# Patient Record
Sex: Male | Born: 1965 | Race: White | Hispanic: No | Marital: Married | Smoking: Former smoker
Health system: Southern US, Community
[De-identification: ages and names within clinical notes are randomized; demographics above are authoritative.]

## PROBLEM LIST (undated history)

## (undated) DIAGNOSIS — C140 Malignant neoplasm of pharynx, unspecified: Secondary | ICD-10-CM

## (undated) DIAGNOSIS — I10 Essential (primary) hypertension: Secondary | ICD-10-CM

## (undated) DIAGNOSIS — F32A Depression, unspecified: Secondary | ICD-10-CM

## (undated) HISTORY — PX: KNEE SURGERY: SHX244

## (undated) HISTORY — PX: REPLACEMENT DISC ANTERIOR LUMBAR SPINE: SUR1215

## (undated) HISTORY — PX: FOOT SURGERY: SHX648

---

## 2008-08-22 ENCOUNTER — Emergency Department (HOSPITAL_COMMUNITY): Admission: EM | Admit: 2008-08-22 | Discharge: 2008-08-22 | Payer: Self-pay | Admitting: Emergency Medicine

## 2009-04-26 ENCOUNTER — Ambulatory Visit: Payer: Self-pay | Admitting: Surgery

## 2009-05-13 ENCOUNTER — Ambulatory Visit: Payer: Self-pay | Admitting: Surgery

## 2009-05-13 ENCOUNTER — Inpatient Hospital Stay (HOSPITAL_COMMUNITY): Admission: RE | Admit: 2009-05-13 | Discharge: 2009-05-16 | Payer: Self-pay | Admitting: Orthopedic Surgery

## 2009-05-14 ENCOUNTER — Encounter (INDEPENDENT_AMBULATORY_CARE_PROVIDER_SITE_OTHER): Payer: Self-pay | Admitting: Orthopedic Surgery

## 2009-08-09 ENCOUNTER — Encounter: Admission: RE | Admit: 2009-08-09 | Discharge: 2009-08-09 | Payer: Self-pay | Admitting: Orthopedic Surgery

## 2009-11-15 ENCOUNTER — Encounter: Admission: RE | Admit: 2009-11-15 | Discharge: 2009-11-15 | Payer: Self-pay | Admitting: Orthopedic Surgery

## 2010-02-21 ENCOUNTER — Encounter: Admission: RE | Admit: 2010-02-21 | Discharge: 2010-02-21 | Payer: Self-pay | Admitting: Orthopedic Surgery

## 2010-05-02 ENCOUNTER — Encounter: Payer: Self-pay | Admitting: Orthopedic Surgery

## 2010-06-30 LAB — BASIC METABOLIC PANEL
CO2: 28 mEq/L (ref 19–32)
Calcium: 10.4 mg/dL (ref 8.4–10.5)
Chloride: 103 mEq/L (ref 96–112)
GFR calc Af Amer: >60 mL/min (ref >60)
Glucose, Bld: 129 mg/dL — ABNORMAL HIGH (ref 70–99)
Sodium: 141 mEq/L (ref 135–145)

## 2010-06-30 LAB — CBC
Hemoglobin: 14.6 g/dL (ref 13.0–17.0)
MCHC: 34.6 g/dL (ref 30.0–36.0)
MCV: 90.5 fL (ref 78.0–100.0)
RBC: 4.64 MIL/uL (ref 4.22–5.81)
RDW: 12.7 % (ref 11.5–15.5)

## 2010-06-30 LAB — POCT I-STAT 7, (LYTES, BLD GAS, ICA,H+H)
Acid-base deficit: 2 mmol/L (ref 0.0–2.0)
Calcium, Ion: 1.2 mmol/L (ref 1.12–1.32)
O2 Saturation: 100 %
Potassium: 4.1 mEq/L (ref 3.5–5.1)
Sodium: 139 mEq/L (ref 135–145)
TCO2: 25 mmol/L (ref 0–100)
pCO2 arterial: 41.9 mmHg (ref 35.0–45.0)

## 2010-08-23 NOTE — Assessment & Plan Note (Signed)
OFFICE VISIT   Dennis Castillo, Dennis Castillo  DOB:  08-15-1965                                       04/26/2009  EAVWU#:[id number]   REASON FOR VISIT:  Evaluate for back surgery.   PRIMARY CARE PHYSICIAN:  Dr. Kathrynn Running.   HISTORY:  This is a 45 year old gentleman who I am seeing at the request  of Dr. Shon Baton in anticipation of anterior lumbar surgery.  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  He complains of back pain as well as a sharp pain radiating down his  right side.  He also developed some left buttock pain.  His pain is  worse with bending over and certainly with lifting.  He does get some  relief with resting as well as his TENS unit.   The patient is obese.  He was tried on metformin to help with weight  loss; however, this did not work.  He is also a Jehovah's Witness. He  states that he is okay with Cell Saver and volume expanders but refuses  blood products.  He suffers from hypercholesterolemia which is medically  managed.   REVIEW OF SYSTEMS:  GENERAL:  Positive for weight gain.  VASCULAR:  Positive for pain in the legs with walking.  PSYCH:  Positive for depression and anxiety.   All other review of systems negative as documented in the encounter  form.   PAST MEDICAL HISTORY:  Hypercholesterolemia and obesity.   PAST SURGICAL HISTORY:  Left knee surgery and right thumb surgery.   FAMILY HISTORY:  Negative for cardiovascular disease at an early age.   SOCIAL HISTORY:  He is married with 5 children.  He is a Therapist, nutritional.  He does not smoke.  He has a history of smoking but quit in  1983.  He drinks 2 beers per week.   ALLERGIES:  None.   MEDICATIONS:  Please see chart.   PHYSICAL EXAMINATION:  Heart rate 78, blood pressure 132/86, temperature  is 97.8.  General:  He is well-appearing, in no distress.  HEENT:  Normal.  Lungs:  Clear bilaterally.  Cardiovascular:  Regular rate and  rhythm.  No carotid bruits.  Palpable posterior tibial pulses.  Abdomen:  Obese, soft.  No hepatosplenomegaly.  Musculoskeletal:  Without major  deformities.  Neurologic:  He has no focal weaknesses.  Skin:  Without  rash.  Psych: He has a normal affect.   ASSESSMENT:  Chronic back pain.   PLAN:  I discussed the risks and benefits of proceeding with anterior  lumbar surgery.  We specifically addressed the risk of bleeding and the  risk of retrograde ejaculation as well as wound infection.  With regards  to him being a Jehovah's Witness, he is okay with receiving Cell Saver  and volume expanders.  He does not wish to bank his own blood.  He  refuses blood transfusions.  He understands that should major venous  bleeding occur that he could die from his operation and he understands  that.  He is scheduled to have his operation on February 3rd.  All of  his questions were answered today.     Jorge Ny, MD  Electronically Signed   VWB/MEDQ  D:  04/26/2009  T:  04/27/2009  Job:  [id number]   cc:   Alvy Beal, MD

## 2012-08-26 ENCOUNTER — Ambulatory Visit (INDEPENDENT_AMBULATORY_CARE_PROVIDER_SITE_OTHER): Payer: Managed Care, Other (non HMO)

## 2012-08-26 ENCOUNTER — Other Ambulatory Visit: Payer: Self-pay | Admitting: Orthopedic Surgery

## 2012-08-26 DIAGNOSIS — M549 Dorsalgia, unspecified: Secondary | ICD-10-CM

## 2012-08-26 DIAGNOSIS — IMO0002 Reserved for concepts with insufficient information to code with codable children: Secondary | ICD-10-CM

## 2012-09-05 ENCOUNTER — Other Ambulatory Visit: Payer: Self-pay | Admitting: Orthopedic Surgery

## 2012-09-05 DIAGNOSIS — M545 Low back pain: Secondary | ICD-10-CM

## 2012-09-11 ENCOUNTER — Ambulatory Visit
Admission: RE | Admit: 2012-09-11 | Discharge: 2012-09-11 | Disposition: A | Discharge status: Home / Self Care | Payer: Managed Care, Other (non HMO) | Source: Ambulatory Visit | Attending: Orthopedic Surgery | Admitting: Orthopedic Surgery

## 2012-09-11 VITALS — BP 154/84 | HR 81

## 2012-09-11 DIAGNOSIS — M545 Low back pain: Secondary | ICD-10-CM

## 2012-09-11 MED ORDER — IOHEXOL 180 MG/ML  SOLN
20.0000 mL | Freq: Once | INTRAMUSCULAR | Status: AC | PRN
  Administered 2012-09-11: 20 mL via INTRATHECAL

## 2012-09-11 MED ORDER — MEPERIDINE HCL 100 MG/ML IJ SOLN
100.0000 mg | Freq: Once | INTRAMUSCULAR | Status: AC
  Administered 2012-09-11: 100 mg via INTRAMUSCULAR

## 2012-09-11 MED ORDER — ONDANSETRON HCL 4 MG/2ML IJ SOLN
4.0000 mg | Freq: Once | INTRAMUSCULAR | Status: AC
  Administered 2012-09-11: 4 mg via INTRAMUSCULAR

## 2012-09-11 MED ORDER — DIAZEPAM 5 MG PO TABS
10.0000 mg | ORAL_TABLET | Freq: Once | ORAL | Status: AC
  Administered 2012-09-11: 10 mg via ORAL

## 2012-09-11 NOTE — Progress Notes (Signed)
Pt states he has been off effexor and adderall for the past 2 days. Discharge instructions explained to pt and his wife.

## 2013-06-07 IMAGING — CT CT L SPINE W/ CM
4 of 11 series · 11 of 33 positions shown, 13 images · non-contrast
Comparison: None.

CLINICAL DATA: Back pain

CT MYELOGRAPHY LUMBAR SPINE
TECHNIQUE: CT imaging of the lumbar spine was performed after
intrathecal contrast administration.  Multiplanar CT image
reconstructions were also generated.

[Series 2: l spine bone · axial · 0.27mm/px · z∈[-347,-95]mm · 3 of 102 slices shown, 4 images]
[im 1/102  soft-tissue]
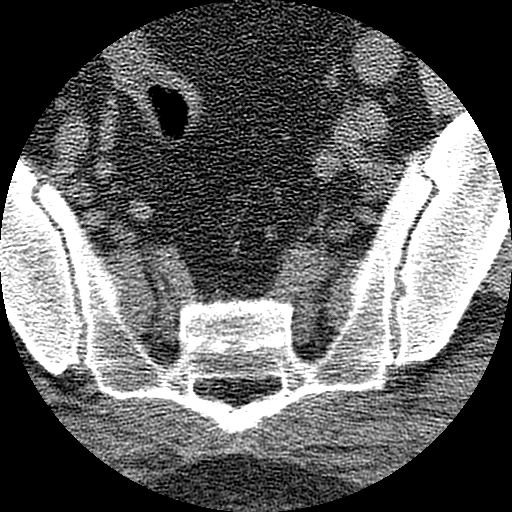
[im 1/102  bone]
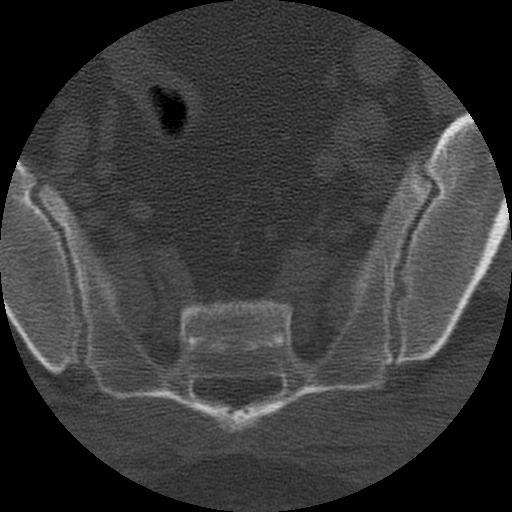
[im 51/102  bone]
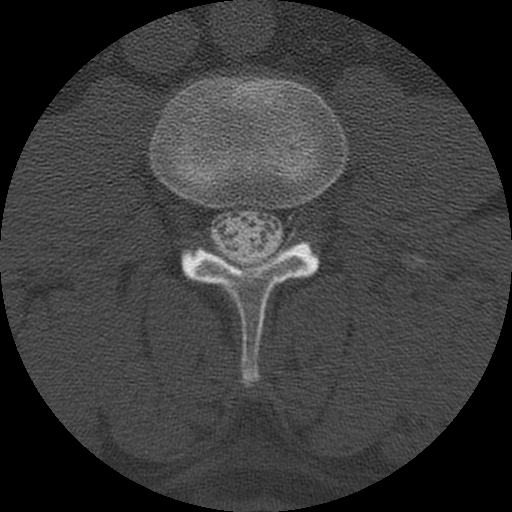
[im 102/102  bone]
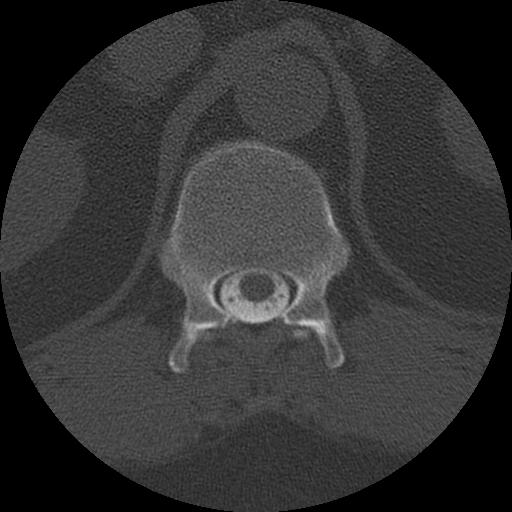

[Series 3: l spine soft · axial · 0.27mm/px · z∈[-262,-180]mm · 2 of 101 slices shown]
[im 34/101  soft-tissue]
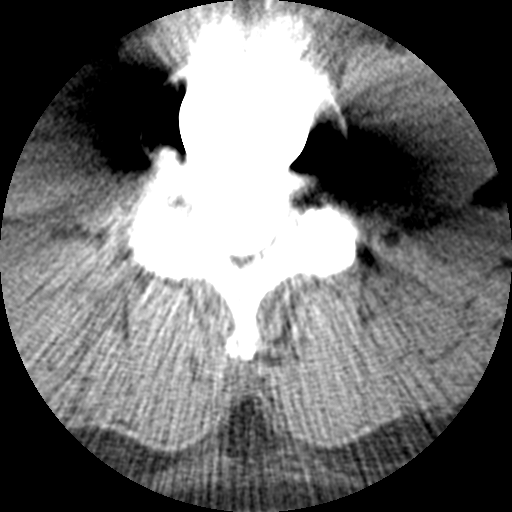
[im 67/101  soft-tissue]
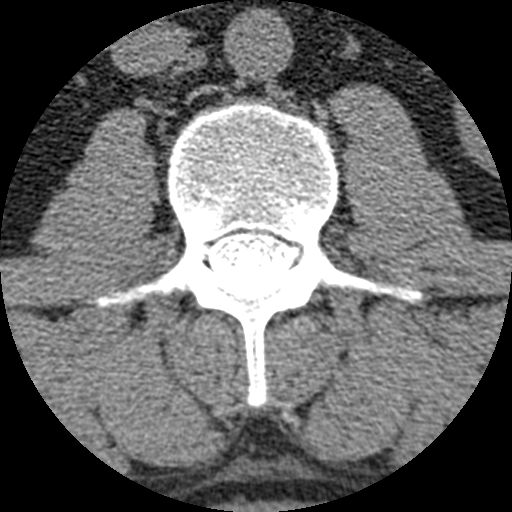

[Series 401: cor lower · coronal · 0.51mm/px · 1 of 59 slices shown]
[im 30/59  bone]
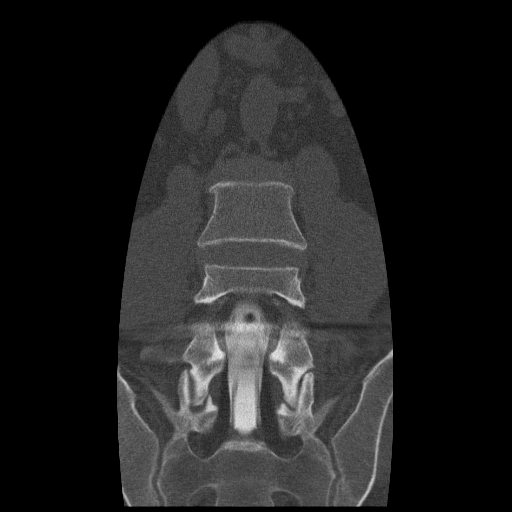

[Series 402: sag · sagittal · 0.51mm/px · 5 of 59 slices shown, 6 images]
[im 20/59  bone]
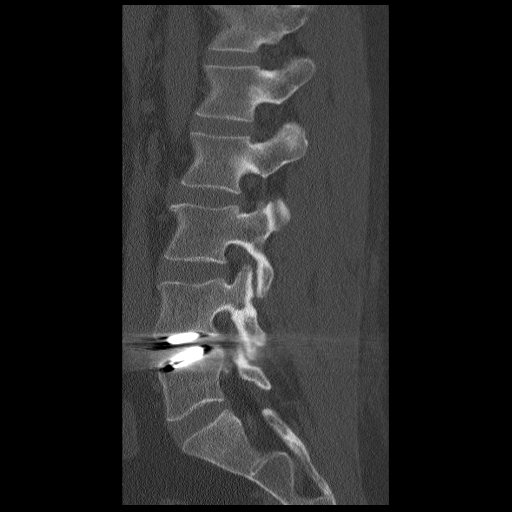
[im 25/59  bone]
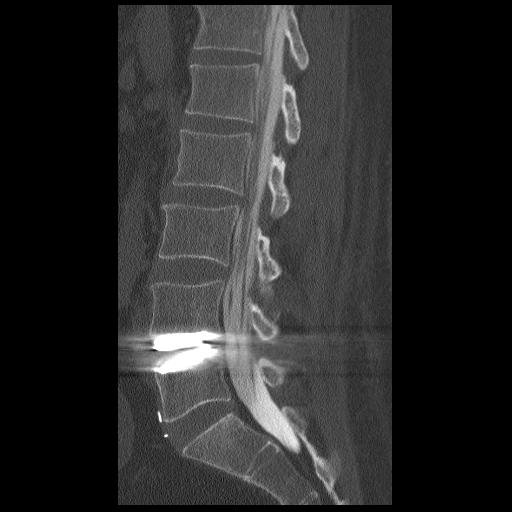
[im 30/59  soft-tissue]
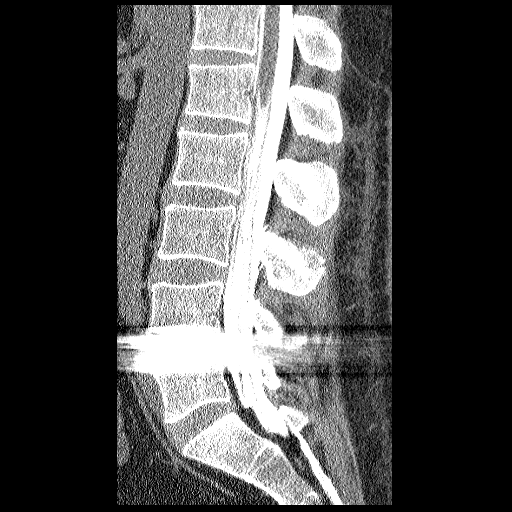
[im 30/59  bone]
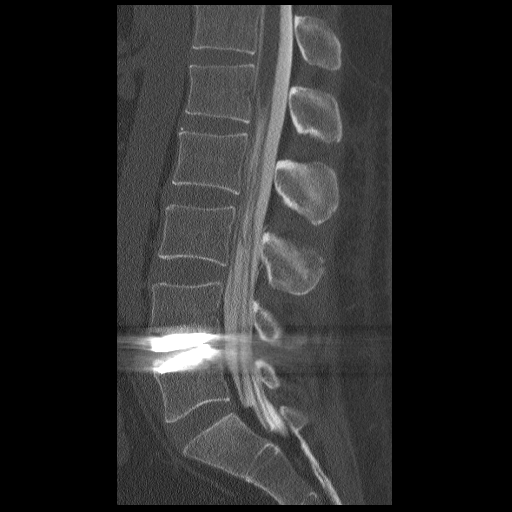
[im 34/59  bone]
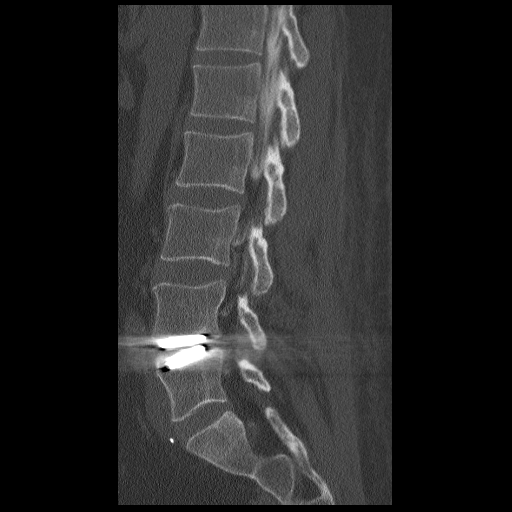
[im 39/59  bone]
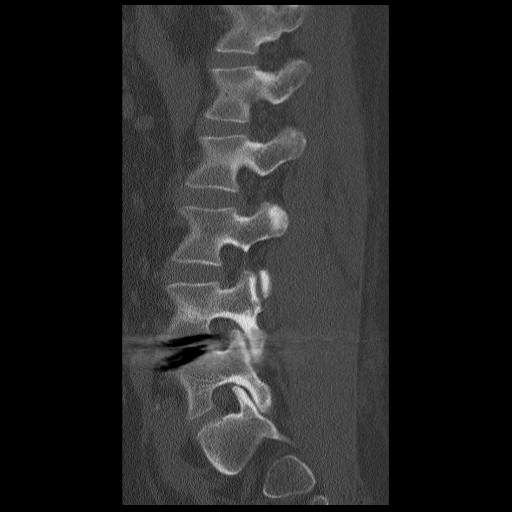

[11 of 33 positions shown; findings below may reference images not displayed]

FINDINGS: Radiologist injection.

Anatomic alignment.  No vertebral compression deformity.  L4-5 disc
arthroplasty is in place.  Conus medullaris terminates at L1-2.

L1-2:  Unremarkable.

L2-3: Shallow left foraminal protrusion without impingement.

L3-4:  Unremarkable.

L4-5:  Disc arthroplasty in place.  No central or foraminal
stenosis.

L5-S1:  Mild left-sided facet arthropathy.  No disc herniation.  No
central or foraminal stenosis.
IMPRESSION: L4-5 disc arthroplasty without complication

Shallow left foraminal L2-3 disc protrusion without impingement.

Mild left facet arthropathy at L5-S1.

## 2013-06-07 IMAGING — RF DG MYELOGRAM LUMBAR
15 of 16 series · 15 of 16 positions shown · IV contrast (omnipaque)
Comparison: none

CLINICAL DATA: Back pain

MYELOGRAM LUMBAR
TECHNIQUE: The procedure, risks, benefits, and alternatives were
explained to the patient. The patient understands and consents.
Under fluoroscopic guidance, a 22 gauge spinal needle was placed in
the CSF space via right L3-4 approach. 20 mL of Omnipaque 180 was
injected.

[Series 1: (hospital) · 1 of 1 slices shown]
[im 1/1]
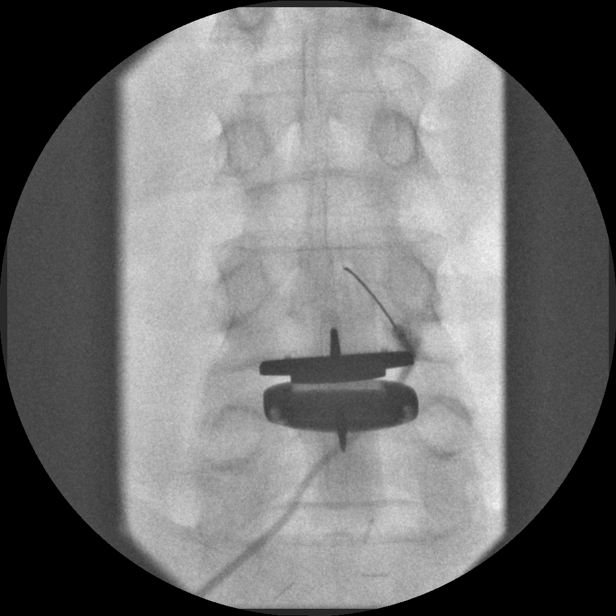

[Series 2: myelogram  white · 1 of 1 slices shown (1 of 11)]
[im 1/1]
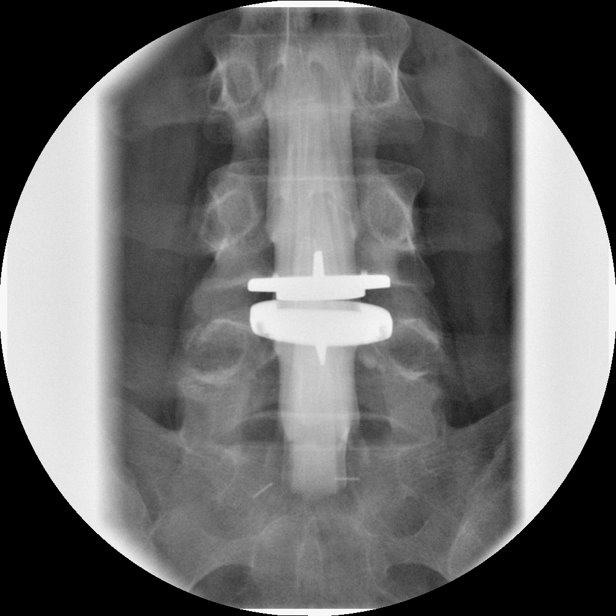

[Series 3: myelogram  white · 1 of 1 slices shown (2 of 11)]
[im 1/1]
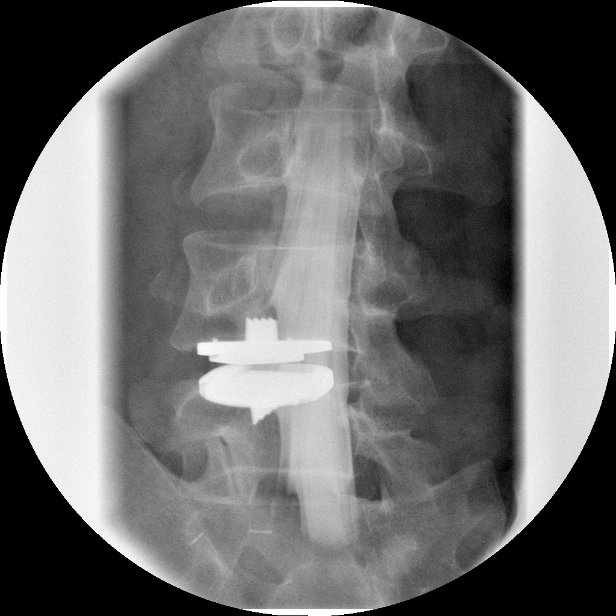

[Series 4: myelogram  white · 1 of 1 slices shown (3 of 11)]
[im 1/1]
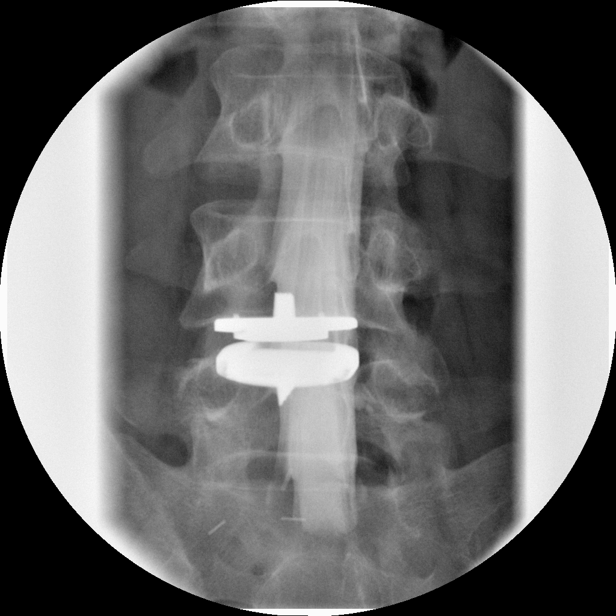

[Series 5: myelogram  white · 1 of 1 slices shown (4 of 11)]
[im 1/1]
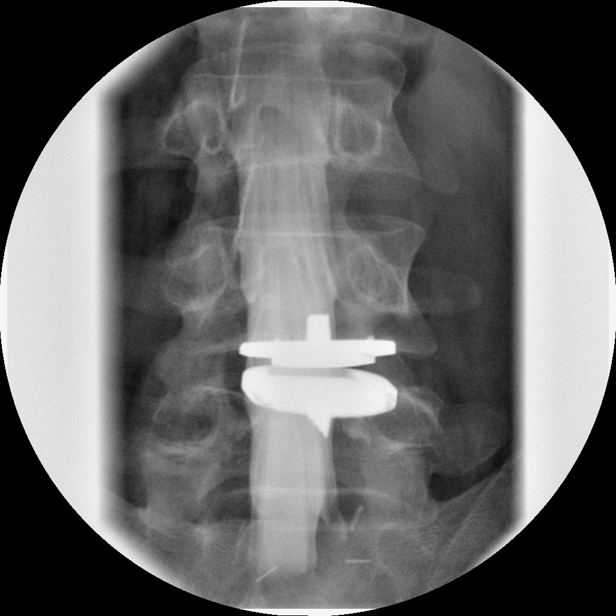

[Series 6: myelogram  white · 1 of 1 slices shown (5 of 11)]
[im 1/1]
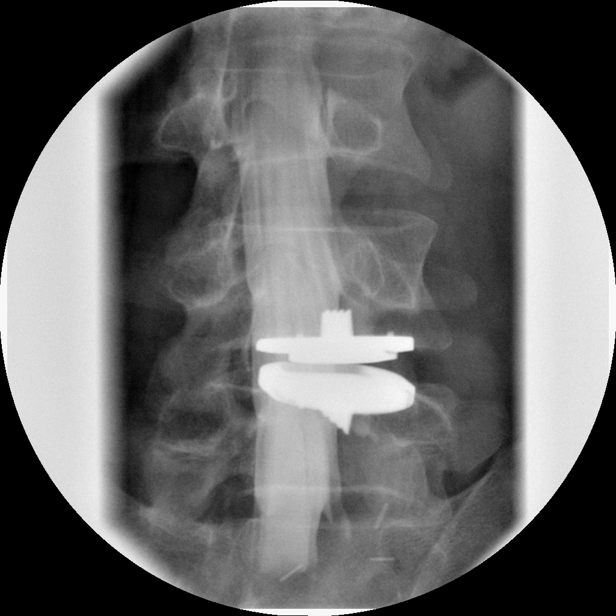

[Series 7: myelogram  white · 1 of 1 slices shown (6 of 11)]
[im 1/1]
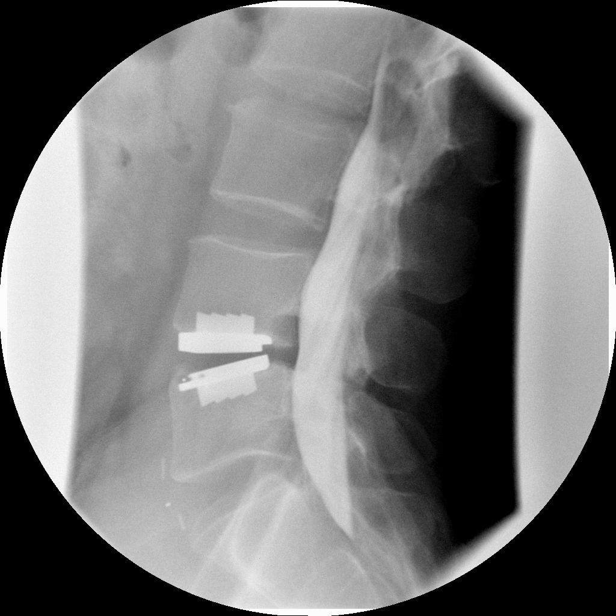

[Series 9: myelogram  white · 1 of 1 slices shown (7 of 11)]
[im 1/1]
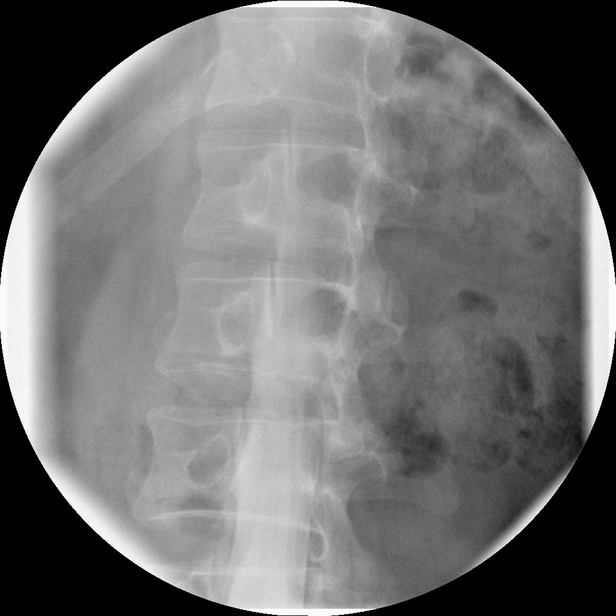

[Series 10: myelogram  white · 1 of 1 slices shown (8 of 11)]
[im 1/1]
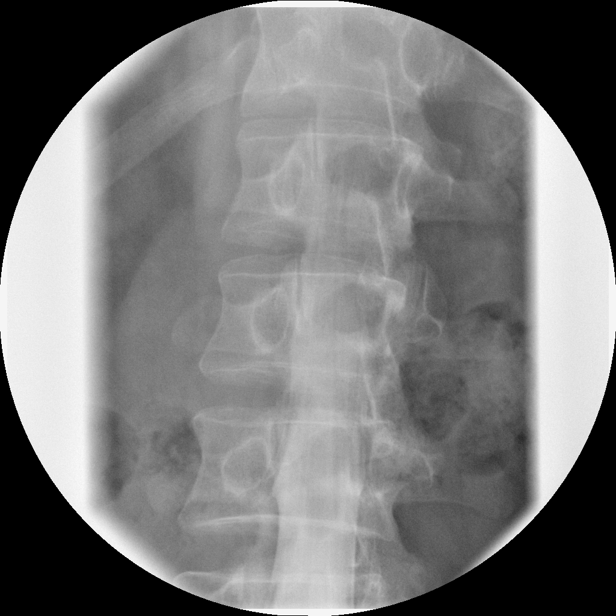

[Series 11: myelogram  white · 1 of 1 slices shown (9 of 11)]
[im 1/1]
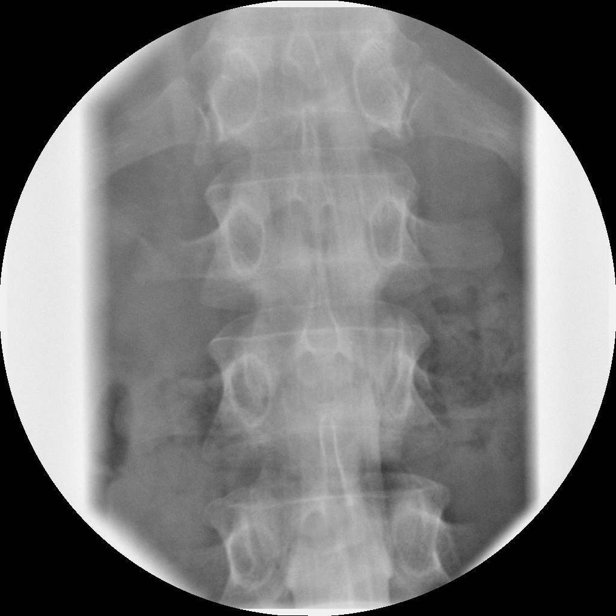

[Series 12: myelogram  white · 1 of 1 slices shown (10 of 11)]
[im 1/1]
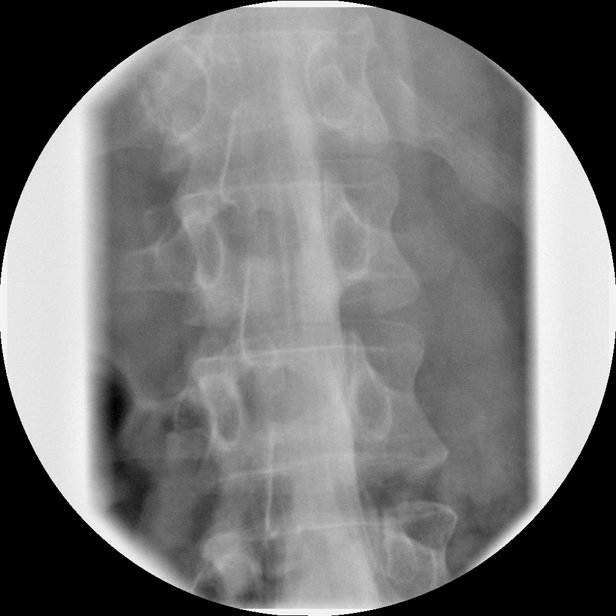

[Series 13: myelogram  white · 1 of 1 slices shown (11 of 11)]
[im 1/1]
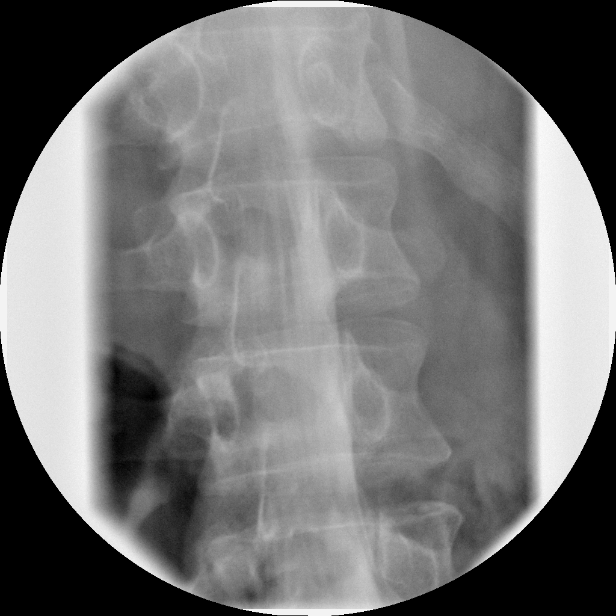

[Series 1001: view not recorded · 0.20mm/px · 1 of 1 slices shown (1 of 3)]
[im 1/1]
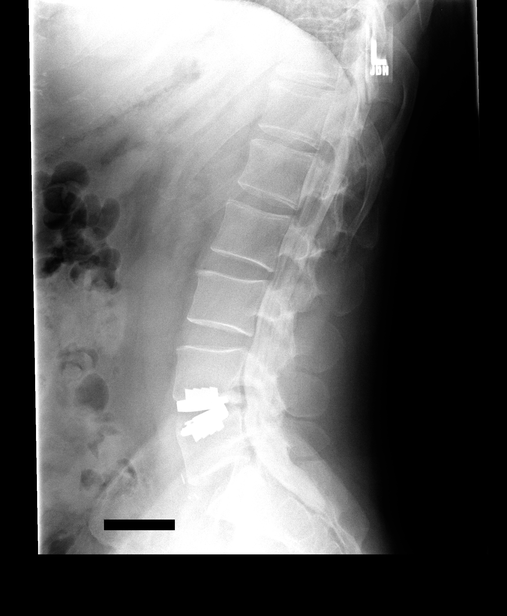

[Series 1002: view not recorded · 0.20mm/px · 1 of 1 slices shown (2 of 3)]
[im 1/1]
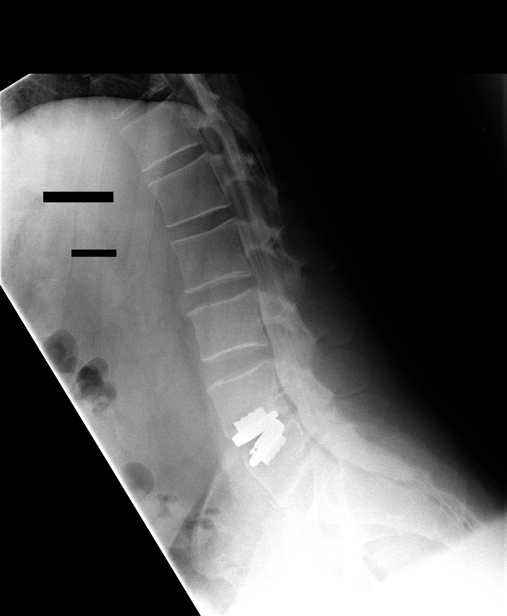

[Series 1003: view not recorded · 0.20mm/px · 1 of 1 slices shown (3 of 3)]
[im 1/1]
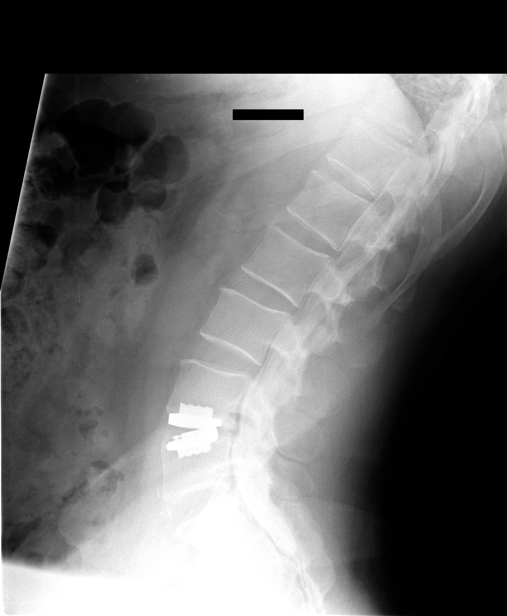

[15 of 16 positions shown; findings below may reference images not displayed]

FINDINGS: Disc arthroplasty and L4-5 is in place.  There is
anatomic alignment of the vertebral bodies.  No obvious spinal
stenosis or neural impingement.  No vertebral compression
deformity. No evidence of instability on the flexion or extension
views.

Complications: None.

Fluoroscopy Time: 2 minutes and 2 seconds.
IMPRESSION: No evidence of impingement.  L4-5 disc arthroplasty without obvious
complication.

## 2022-08-08 ENCOUNTER — Emergency Department (HOSPITAL_BASED_OUTPATIENT_CLINIC_OR_DEPARTMENT_OTHER): Payer: BC Managed Care – PPO

## 2022-08-08 ENCOUNTER — Emergency Department (HOSPITAL_BASED_OUTPATIENT_CLINIC_OR_DEPARTMENT_OTHER)
Admission: EM | Admit: 2022-08-08 | Discharge: 2022-08-08 | Disposition: A | Discharge status: Home / Self Care | Payer: BC Managed Care – PPO | Attending: Emergency Medicine | Admitting: Emergency Medicine

## 2022-08-08 ENCOUNTER — Emergency Department (HOSPITAL_COMMUNITY): Payer: BC Managed Care – PPO

## 2022-08-08 ENCOUNTER — Other Ambulatory Visit: Payer: Self-pay

## 2022-08-08 ENCOUNTER — Encounter (HOSPITAL_BASED_OUTPATIENT_CLINIC_OR_DEPARTMENT_OTHER): Payer: Self-pay | Admitting: Emergency Medicine

## 2022-08-08 DIAGNOSIS — Z85818 Personal history of malignant neoplasm of other sites of lip, oral cavity, and pharynx: Secondary | ICD-10-CM | POA: Insufficient documentation

## 2022-08-08 DIAGNOSIS — I10 Essential (primary) hypertension: Secondary | ICD-10-CM | POA: Insufficient documentation

## 2022-08-08 DIAGNOSIS — R42 Dizziness and giddiness: Secondary | ICD-10-CM | POA: Diagnosis present

## 2022-08-08 DIAGNOSIS — Z87891 Personal history of nicotine dependence: Secondary | ICD-10-CM | POA: Insufficient documentation

## 2022-08-08 HISTORY — DX: Malignant neoplasm of pharynx, unspecified: C14.0

## 2022-08-08 HISTORY — DX: Essential (primary) hypertension: I10

## 2022-08-08 HISTORY — DX: Depression, unspecified: F32.A

## 2022-08-08 LAB — CBC
HCT: 36.3 % — ABNORMAL LOW (ref 39.0–52.0)
Hemoglobin: 10.4 g/dL — ABNORMAL LOW (ref 13.0–17.0)
MCH: 20 pg — ABNORMAL LOW (ref 26.0–34.0)
MCHC: 28.7 g/dL — ABNORMAL LOW (ref 30.0–36.0)
MCV: 69.7 fL — ABNORMAL LOW (ref 80.0–100.0)
Platelets: 269 10*3/uL (ref 150–400)
RBC: 5.21 MIL/uL (ref 4.22–5.81)
RDW: 19.7 % — ABNORMAL HIGH (ref 11.5–15.5)
WBC: 9.4 10*3/uL (ref 4.0–10.5)
nRBC: 0 % (ref 0.0–0.2)

## 2022-08-08 LAB — BASIC METABOLIC PANEL
Anion gap: 9 (ref 5–15)
BUN: 15 mg/dL (ref 6–20)
CO2: 28 mmol/L (ref 22–32)
Calcium: 9.9 mg/dL (ref 8.9–10.3)
Chloride: 102 mmol/L (ref 98–111)
Creatinine, Ser: 0.89 mg/dL (ref 0.61–1.24)
GFR, Estimated: >60 mL/min (ref >60)
Glucose, Bld: 106 mg/dL — ABNORMAL HIGH (ref 70–99)
Potassium: 3.8 mmol/L (ref 3.5–5.1)
Sodium: 139 mmol/L (ref 135–145)

## 2022-08-08 MED ORDER — PROCHLORPERAZINE EDISYLATE 10 MG/2ML IJ SOLN
5.0000 mg | Freq: Once | INTRAMUSCULAR | Status: AC
  Administered 2022-08-08: 5 mg via INTRAVENOUS
  Filled 2022-08-08: qty 2

## 2022-08-08 MED ORDER — LORAZEPAM 2 MG/ML IJ SOLN
1.0000 mg | Freq: Once | INTRAMUSCULAR | Status: AC
  Administered 2022-08-08: 1 mg via INTRAVENOUS
  Filled 2022-08-08: qty 1

## 2022-08-08 MED ORDER — ONDANSETRON 4 MG PO TBDP
8.0000 mg | ORAL_TABLET | Freq: Once | ORAL | Status: DC
  Filled 2022-08-08: qty 2

## 2022-08-08 MED ORDER — LORAZEPAM 2 MG/ML IJ SOLN
INTRAMUSCULAR | Status: AC
  Filled 2022-08-08: qty 1

## 2022-08-08 MED ORDER — SODIUM CHLORIDE 0.9 % IV BOLUS
1000.0000 mL | Freq: Once | INTRAVENOUS | Status: AC
  Administered 2022-08-08: 1000 mL via INTRAVENOUS

## 2022-08-08 MED ORDER — LORAZEPAM 1 MG PO TABS: 1.0000 mg | ORAL_TABLET | Freq: Three times a day (TID) | ORAL | 0 refills | Status: DC | PRN

## 2022-08-08 NOTE — ED Triage Notes (Signed)
Vertigo for the past 3 weeks causing him to be unable to move without vomiting, now causing weakness. His ENT told him there was not they could do for this; states vertigo is secondary to radiation for throat cancer.  He is concerned that this may be something more as it has lasted so long.

## 2022-08-08 NOTE — ED Notes (Signed)
Report called to Tresa Endo, RN at [facility] ED

## 2022-08-08 NOTE — ED Provider Notes (Signed)
  Physical Exam  BP 136/78   Pulse 77   Temp 98 F (36.7 C) (Oral)   Resp 18   Wt 106.6 kg   SpO2 100%   Physical Exam  Procedures  Procedures  ED Course / MDM    Medical Decision Making Amount and/or Complexity of Data Reviewed Labs: ordered. Radiology: ordered.  Risk Prescription drug management.   Transfer for MRI.  Vertigo.  Severe.  Having difficulty walking due to it.  Has previously seen ENT.  Treated with Ativan prior to arrival and Compazine once arrived.  Continued symptoms.  Did not tolerate full MRI due to of reported panic attack.  However patient eager to go home.  Does not want to stay longer.  Will give symptomatic treatment for home.  Instructed patient to watch out for interactions with the medicine as he is already on.  Follow-up with ENT and request to see [facility] ENT.  MRI not completed but did not show acute stroke.      Benjiman Core, MD 08/08/22 [phone]

## 2022-08-08 NOTE — ED Provider Notes (Signed)
[facility]ERGENCY [facility] Emergency Department Provider Note MRN:  [id number]  Arrival date & time: 08/08/22     Chief Complaint   Vertigo History of Present Illness   Dennis Castillo is a 57 y.o. year-old male with a history of hypertension, head neck cancer presenting to the ED with chief complaint of vertigo.  Persistent room spinning dizziness for the past 2 or 3 weeks.  Worse over the past 2 or 3 days.  Worsening headaches over the past 2 or 3 days.  Denies numbness or weakness to the arms or legs.  Tired of feeling this way.  Was told by ENT that there is nothing to do.  Review of Systems  A thorough review of systems was obtained and all systems are negative except as noted in the HPI and PMH.   Patient's Health History    Past Medical History:  Diagnosis Date   Depression    HTN (hypertension)    Throat cancer (HCC)       No family history on file.  Social History   Socioeconomic History   Marital status: Married    Spouse name: Not on file   Number of children: Not on file   Years of education: Not on file   Highest education level: Not on file  Occupational History   Not on file  Tobacco Use   Smoking status: Former   Smokeless tobacco: Never   Tobacco comments:    smoked from 10 to 71 and has since quit  Substance and Sexual Activity   Alcohol use: Not on file   Drug use: Not on file   Sexual activity: Not on file  Other Topics Concern   Not on file  Social History Narrative   Not on file   Social Determinants of Health   Financial Resource Strain: Not on file  Food Insecurity: Not on file  Transportation Needs: Not on file  Physical Activity: Not on file  Stress: Not on file  Social Connections: Not on file  Intimate Partner Violence: Not on file     Physical Exam   Vitals:   08/08/22 0443  BP: (!) 167/99  Pulse: 70  Resp: 18  Temp: 98 F (36.7 C)  SpO2: 100%    CONSTITUTIONAL: Well-appearing, NAD NEURO/PSYCH:  Alert and  oriented x 3, no focal deficits EYES:  eyes equal and reactive ENT/NECK:  no LAD, no JVD CARDIO: Regular rate, well-perfused, normal S1 and S2 PULM:  CTAB no wheezing or rhonchi GI/GU:  non-distended, non-tender MSK/SPINE:  No gross deformities, no edema SKIN:  no rash, atraumatic   *Additional and/or pertinent findings included in MDM below  Diagnostic and Interventional Summary    EKG Interpretation  Date/Time:  Tuesday August 08 2022 04:46:29 EDT Ventricular Rate:  69 PR Interval:  144 QRS Duration: 102 QT Interval:  402 QTC Calculation: 431 R Axis:   76 Text Interpretation: Sinus rhythm Confirmed by Kennis Carina [phone]) on 08/08/2022 5:00:26 AM       Labs Reviewed  CBC - Abnormal; Notable for the following components:      Result Value   Hemoglobin 10.4 (*)    HCT 36.3 (*)    MCV 69.7 (*)    MCH 20.0 (*)    MCHC 28.7 (*)    RDW 19.7 (*)    All other components within normal limits  BASIC METABOLIC PANEL - Abnormal; Notable for the following components:   Glucose, Bld 106 (*)  All other components within normal limits    CT HEAD WO CONTRAST ( )  Final Result    MR BRAIN WO CONTRAST    (Results Pending)    Medications  sodium chloride 0.9 % bolus 1,000 mL (1,000 mLs Intravenous New Bag/Given 08/08/22 0508)  LORazepam (ATIVAN) injection 1 mg ( Intravenous Not Given 08/08/22 0530)     Procedures  /  Critical Care Procedures  ED Course and Medical Decision Making  Initial Impression and Ddx Patient theorizes that his vertigo is due to chemo/radiation that he received in the past for head neck cancer.  This is possible, as is central vertigo, BPPV.  He describes vertigo, room spinning sensation very clearly.  No focal neurological deficits on exam.  Other considerations include intracranial mass, metastatic disease.  Past medical/surgical history that increases complexity of ED encounter: History of head neck cancer  Interpretation of Diagnostics I  personally reviewed the laboratory assessment and my interpretation is as follows: No significant blood count or electrolyte disturbance  CT head is without acute findings.  Patient Reassessment and Ultimate Disposition/Management     Will transfer to [facility] for MRI for further evaluation.  Accepting Dr. Drema Pry.  Patient management required discussion with the following services or consulting groups:  None  Complexity of Problems Addressed Acute illness or injury that poses threat of life of bodily function  Additional Data Reviewed and Analyzed Further history obtained from: Further history from spouse/family member  Additional Factors Impacting ED Encounter Risk Use of parenteral controlled substances  Elmer Sow. Pilar Plate, MD [facility] Emergency Medicine [facility] [email or web address]  Final Clinical Impressions(s) / ED Diagnoses     ICD-10-CM   1. Vertigo  R42       ED Discharge Orders     None        Discharge Instructions Discussed with and Provided to Patient:    Discharge Instructions      We are transferring you to [facility] emergency department for MRI.  You have elected to travel via private vehicle.  Go straight to the emergency department.      Sabas Sous, MD 08/08/22 [phone]

## 2022-08-08 NOTE — ED Notes (Signed)
Patient transported to MRI 

## 2022-08-08 NOTE — Discharge Instructions (Addendum)
Watch for oversedation with the combination of the Ativan and your pain medicine.

## 2022-08-08 NOTE — ED Notes (Signed)
Patient departing for [facility] with IV still in place, patent, clean, dry, flushes with ease.

## 2022-08-24 ENCOUNTER — Encounter: Payer: Self-pay | Admitting: Physician Assistant

## 2022-08-25 ENCOUNTER — Other Ambulatory Visit: Payer: Self-pay | Admitting: Physician Assistant

## 2022-08-25 DIAGNOSIS — H93A2 Pulsatile tinnitus, left ear: Secondary | ICD-10-CM

## 2022-09-05 ENCOUNTER — Ambulatory Visit
Admission: RE | Admit: 2022-09-05 | Discharge: 2022-09-05 | Disposition: A | Discharge status: Home / Self Care | Payer: BC Managed Care – PPO | Source: Ambulatory Visit | Attending: Physician Assistant | Admitting: Physician Assistant

## 2022-09-05 DIAGNOSIS — H93A2 Pulsatile tinnitus, left ear: Secondary | ICD-10-CM

## 2022-09-05 MED ORDER — IOPAMIDOL (ISOVUE-370) INJECTION 76%
75.0000 mL | Freq: Once | INTRAVENOUS | Status: AC | PRN
  Administered 2022-09-05: 75 mL via INTRAVENOUS

## 2022-10-04 ENCOUNTER — Telehealth: Payer: Self-pay | Admitting: Neurology

## 2022-10-04 ENCOUNTER — Ambulatory Visit: Payer: BC Managed Care – PPO | Admitting: Neurology

## 2022-10-04 ENCOUNTER — Encounter: Payer: Self-pay | Admitting: Neurology

## 2022-10-04 ENCOUNTER — Telehealth (HOSPITAL_COMMUNITY): Payer: Self-pay

## 2022-10-04 VITALS — BP 119/70 | HR 75 | Ht 70.0 in | Wt 243.0 lb

## 2022-10-04 DIAGNOSIS — G43719 Chronic migraine without aura, intractable, without status migrainosus: Secondary | ICD-10-CM

## 2022-10-04 DIAGNOSIS — R42 Dizziness and giddiness: Secondary | ICD-10-CM

## 2022-10-04 DIAGNOSIS — Z8669 Personal history of other diseases of the nervous system and sense organs: Secondary | ICD-10-CM | POA: Diagnosis not present

## 2022-10-04 DIAGNOSIS — H9312 Tinnitus, left ear: Secondary | ICD-10-CM | POA: Diagnosis not present

## 2022-10-04 DIAGNOSIS — R519 Headache, unspecified: Secondary | ICD-10-CM

## 2022-10-04 MED ORDER — LORAZEPAM 0.5 MG PO TABS: 0.5000 mg | ORAL_TABLET | ORAL | 0 refills | Status: DC

## 2022-10-04 NOTE — Patient Instructions (Addendum)
I had a long discussion with patient regarding his vertigo and tinnitus as well as chronic daily headaches which likely represent transformed migraine and tension headaches with analgesic rebound.  I recommend he discontinue over-the-counter pain medications and stick to a long acting narcotics for his chronic back and neck pain.  I also recommend he do regular neck stretching exercises.  Check MRI scan of the brain with and without contrast with pain section through internal auditory canals and diagnostic cerebral catheter angiogram to look for left jugular vein web or stenosis.  Return for follow-up in 4 months or call earlier if necessary.     Marland KitchenMarland Kitchen

## 2022-10-04 NOTE — Progress Notes (Signed)
[facility] [address] [place]. [place] [address] [phone]       OFFICE CONSULT NOTE  [name] Date of Birth:  January 29, 1966 Medical Record Number:  [id number]   Referring MD:  Dennis Gandy, PA-c  Reason for Referral: Dizziness and dizzy spells  HPI: Dennis Castillo is a 57 year old Caucasian male seen today for initial office consultation visit.  He is accompanied by his wife.  History is obtained from them and review of electronic medical records in care everywhere.  I have reviewed pertinent available imaging films in PACS.  He has past medical history of hypertension, depression and throat cancer.  He states for the last 2 years he has had nonpulsatile tinnitus in his left ear which was bad for the initial few weeks but then settled down.  For the last month or so it seems to have gotten worse again.  He is able to sleep despite the tinnitus.  He describes it as a whooshing sound.  This does not appear to be related to any neck position or putting pressure on the neck does not get rid of it.  He has been requiring sleeping aids to sleep he initially took Ambien and 1 more recently he was changed to [place].  On 07/17/2022 he developed sudden onset of dizziness.  He describes this as a sensation of room spinning in the anticoagulates direction.  This was initially intermittent in the first couple of weeks but now has progressed and become constant.  This seems to occur irrespective of any head position or movement.  He also describes sensation of head pressure and aching and throbbing headaches with occasionally becomes severe 9/10 in severity with accompanying nausea.  No light or sound sensitivity.  Headache occurs several times a day.  Over-the-counter medication does take the edge off.  He does have history of chronic migraines but feels these headaches are different.  He takes zonisamide 25mg  as needed which helps him.  He denies any visual symptoms or focal  neurological symptoms accompanying his headache.  He does have a history of bilateral ear tympanoplasty with tinnitus began only several years later.  He denies any history of stroke TIA seizures or other significant neurological problems.  He has been seen in the headache clinic in [place] for his chronic migraine and was initially treated with Botox.  He does have chronic back pain and has undergone back surgery and takes MS Contin and oxycodone for his back and foot pain.  He did try meclizine for his dizziness and vertigo but it did not help so he stopped it.  He had an MRI scan of the brain done on 08/08/2022 which is quite limited due to poor patient cooperation only a few sequences were obtained.  CT angiogram of the brain and neck on 08/08/2022 shows chronic right vertebral artery occlusion.  He was seen at ENT clinic at [facility] health and had audiometric testing done and was found not to have benign paroxysmal positional vertigo.  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  ROS:   14 system review of systems is positive for dizziness, vertigo, tinnitus, leg pain, difficulty walking, headache all other systems negative  PMH:  Past Medical History:  Diagnosis Date   Depression    HTN (hypertension)    Throat cancer (HCC)     Social History:  Social History   Socioeconomic History   Marital status: Married    Spouse name: Not on file   Number  of children: Not on file   Years of education: Not on file   Highest education level: Not on file  Occupational History   Not on file  Tobacco Use   Smoking status: Former   Smokeless tobacco: Never   Tobacco comments:    smoked from 10 to 31 and has since quit, tobacco dip for 4-5 yrs and has been quit  Substance and Sexual Activity   Alcohol use: Not Currently   Drug use: Not Currently   Sexual activity: Not on file  Other Topics Concern   Not on file  Social History Narrative   Not on file   Social Determinants of Health    Financial Resource Strain: Not on file  Food Insecurity: Not on file  Transportation Needs: Not on file  Physical Activity: Not on file  Stress: Not on file  Social Connections: Not on file  Intimate Partner Violence: Not on file    Medications:   Current Outpatient Medications on File Prior to Visit  Medication Sig Dispense Refill   albuterol (VENTOLIN HFA) 108 (90 Base) MCG/ACT inhaler Inhale 1-2 puffs into the lungs every 6 (six) hours as needed for wheezing or shortness of breath.     aspirin EC 81 MG tablet Take 81 mg by mouth daily.     busPIRone (BUSPAR) 10 MG tablet Take 1 tablet by mouth 2 (two) times daily as needed (anxiety).     cetirizine (ZYRTEC) 10 MG tablet Take 10 mg by mouth daily as needed for allergies or rhinitis.     cyclobenzaprine (FLEXERIL) 5 MG tablet Take 5 mg by mouth daily as needed for muscle spasms.     docusate sodium (COLACE) 100 MG capsule Take 100 mg by mouth 2 (two) times daily as needed for mild constipation or moderate constipation.     eszopiclone (LUNESTA) 2 MG TABS tablet Take 2 mg by mouth at bedtime.     FLUoxetine (PROZAC) 20 MG capsule Take 20 mg by mouth daily.     gabapentin (NEURONTIN) 300 MG capsule Take 300 mg by mouth as needed.     levothyroxine (SYNTHROID) 137 MCG tablet Take 137 mcg by mouth daily before breakfast.     metoprolol succinate (TOPROL-XL) 25 MG 24 hr tablet Take 12.5 mg by mouth daily.     morphine (MS CONTIN) 30 MG 12 hr tablet Take 30 mg by mouth every 12 (twelve) hours.     naloxone (NARCAN) nasal spray 4 mg/0.1 mL Place 1 spray into the nose once.     omeprazole (PRILOSEC) 20 MG capsule Take 20 mg by mouth daily as needed.     ondansetron (ZOFRAN) 4 MG tablet Take 4 mg by mouth every 8 (eight) hours as needed for nausea.     oxyCODONE-acetaminophen (PERCOCET) 10-325 MG tablet Take 1 tablet by mouth every 6 (six) hours as needed for pain.     polyethylene glycol powder (GLYCOLAX/MIRALAX) 17 GM/SCOOP powder Take 1  Container by mouth daily.     testosterone cypionate (DEPOTESTOSTERONE CYPIONATE) 200 MG/ML injection Inject 200 mg into the muscle every 14 (fourteen) days.     zonisamide (ZONEGRAN) 25 MG capsule Take 25 mg by mouth daily as needed (migraine).     No current facility-administered medications on file prior to visit.    Allergies:   Allergies  Allergen Reactions   Statins Other (See Comments)    Flu like symptoms, muscle weakness    Physical Exam General: Obese middle-aged Caucasian male, seated, in  no evident distress Head: head normocephalic and atraumatic.   Neck: supple with no carotid or supraclavicular bruits Cardiovascular: regular rate and rhythm, no murmurs Musculoskeletal: no deformity.  Right foot boot for recent surgery Skin:  no rash/petichiae Vascular:  Normal pulses all extremities  Neurologic Exam Mental Status: Awake and fully alert. Oriented to place and time. Recent and remote memory intact. Attention span, concentration and fund of knowledge appropriate. Mood and affect appropriate.  Cranial Nerves: Fundoscopic exam reveals sharp disc margins. Pupils equal, briskly reactive to light. Extraocular movements full without nystagmus. Visual fields full to confrontation. Hearing intact. Facial sensation intact. Face, tongue, palate moves normally and symmetrically.  Motor: Normal bulk and tone. Normal strength in all tested extremity muscles.  Right foot strength testing limited due to recent surgery and boot Sensory.: intact to touch , pinprick , position and vibratory sensation.  Coordination: Rapid alternating movements normal in all extremities. Finger-to-nose and heel-to-shin performed accurately bilaterally.  Positive Fukuda test with patient moving off base and rotating to the left.  Headshaking produces subjective dizziness but no objective nystagmus. Gait and Station: Arises from chair without difficulty. Stance is normal. Gait is cautious with favoring of the  right foot due to boot and recent surgery.  Did not test tandem walking  reflexes: 1+ and symmetric. Toes downgoing.       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  Longstanding history of migraine headaches with recent transformed chronic daily headaches with component of tension headache and analgesic rebound as well.     PLAN:I had a long discussion with patient regarding his vertigo and tinnitus as well as chronic daily headaches which likely represent transformed migraine and tension headaches with analgesic rebound.  I recommend he discontinue over-the-counter pain medications and stick to a long acting narcotics for his chronic back and neck pain.  I also recommend he do regular neck stretching exercises.  Check MRI scan of the brain with and without contrast with pain section through internal auditory canals and diagnostic cerebral catheter angiogram to look for left jugular vein web or stenosis.  Return for follow-up in 4 months or call earlier if necessary.  Greater than 50% time during this prolonged 60-minute consultation visit was spent in counseling and coordination of care about his dizziness, tinnitus and discussion about evaluation and treatment and answering questions.  Delia Heady, MD Note: This document was prepared with digital dictation and possible smart phrase technology. Any transcriptional errors that result from this process are unintentional.

## 2022-10-04 NOTE — Telephone Encounter (Signed)
[place] Numbers: [id number] exp. 10/04/22-11/02/22 sent to [facility] for open MRI [phone]

## 2022-10-04 NOTE — Telephone Encounter (Signed)
Dr. Baldemar Lenis agreed to do angiogram 7/9 requested by Dr. Pearlean Brownie. AB

## 2022-10-16 ENCOUNTER — Other Ambulatory Visit (HOSPITAL_COMMUNITY): Payer: Self-pay | Admitting: Student

## 2022-10-16 DIAGNOSIS — H93A2 Pulsatile tinnitus, left ear: Secondary | ICD-10-CM

## 2022-10-17 ENCOUNTER — Other Ambulatory Visit: Payer: Self-pay | Admitting: Neurology

## 2022-10-17 ENCOUNTER — Ambulatory Visit (HOSPITAL_COMMUNITY)
Admission: RE | Admit: 2022-10-17 | Discharge: 2022-10-17 | Disposition: A | Discharge status: Home / Self Care | Payer: BC Managed Care – PPO | Source: Ambulatory Visit | Attending: Neurology | Admitting: Neurology

## 2022-10-17 ENCOUNTER — Encounter (HOSPITAL_COMMUNITY): Payer: Self-pay

## 2022-10-17 ENCOUNTER — Other Ambulatory Visit: Payer: Self-pay

## 2022-10-17 DIAGNOSIS — R42 Dizziness and giddiness: Secondary | ICD-10-CM | POA: Insufficient documentation

## 2022-10-17 DIAGNOSIS — Z9221 Personal history of antineoplastic chemotherapy: Secondary | ICD-10-CM | POA: Diagnosis not present

## 2022-10-17 DIAGNOSIS — I10 Essential (primary) hypertension: Secondary | ICD-10-CM | POA: Diagnosis not present

## 2022-10-17 DIAGNOSIS — H9312 Tinnitus, left ear: Secondary | ICD-10-CM

## 2022-10-17 DIAGNOSIS — Z923 Personal history of irradiation: Secondary | ICD-10-CM | POA: Insufficient documentation

## 2022-10-17 DIAGNOSIS — G43909 Migraine, unspecified, not intractable, without status migrainosus: Secondary | ICD-10-CM | POA: Diagnosis not present

## 2022-10-17 DIAGNOSIS — Z87891 Personal history of nicotine dependence: Secondary | ICD-10-CM | POA: Diagnosis not present

## 2022-10-17 DIAGNOSIS — H93A2 Pulsatile tinnitus, left ear: Secondary | ICD-10-CM

## 2022-10-17 HISTORY — PX: IR NEURO EACH ADD'L AFTER BASIC UNI LEFT (MS): IMG5373

## 2022-10-17 HISTORY — PX: IR US GUIDE VASC ACCESS RIGHT: IMG2390

## 2022-10-17 HISTORY — PX: IR ANGIO VERTEBRAL SEL SUBCLAVIAN INNOMINATE UNI R MOD SED: IMG5365

## 2022-10-17 HISTORY — PX: IR ANGIO VERTEBRAL SEL VERTEBRAL UNI L MOD SED: IMG5367

## 2022-10-17 HISTORY — PX: IR ANGIO INTRA EXTRACRAN SEL INTERNAL CAROTID BILAT MOD SED: IMG5363

## 2022-10-17 HISTORY — PX: IR ANGIO EXTERNAL CAROTID SEL EXT CAROTID BILAT MOD SED: IMG5372

## 2022-10-17 LAB — CBC
HCT: 35.5 % — ABNORMAL LOW (ref 39.0–52.0)
Hemoglobin: 10.2 g/dL — ABNORMAL LOW (ref 13.0–17.0)
MCH: 20.2 pg — ABNORMAL LOW (ref 26.0–34.0)
MCHC: 28.7 g/dL — ABNORMAL LOW (ref 30.0–36.0)
MCV: 70.3 fL — ABNORMAL LOW (ref 80.0–100.0)
Platelets: 276 10*3/uL (ref 150–400)
RBC: 5.05 MIL/uL (ref 4.22–5.81)
RDW: 20.3 % — ABNORMAL HIGH (ref 11.5–15.5)
WBC: 6.4 10*3/uL (ref 4.0–10.5)
nRBC: 0 % (ref 0.0–0.2)

## 2022-10-17 LAB — BASIC METABOLIC PANEL
Anion gap: 13 (ref 5–15)
BUN: 14 mg/dL (ref 6–20)
CO2: 21 mmol/L — ABNORMAL LOW (ref 22–32)
Calcium: 9.2 mg/dL (ref 8.9–10.3)
Chloride: 105 mmol/L (ref 98–111)
Creatinine, Ser: 0.98 mg/dL (ref 0.61–1.24)
GFR, Estimated: >60 mL/min (ref >60)
Glucose, Bld: 98 mg/dL (ref 70–99)
Potassium: 3.6 mmol/L (ref 3.5–5.1)
Sodium: 139 mmol/L (ref 135–145)

## 2022-10-17 MED ORDER — HYDROCODONE-ACETAMINOPHEN 5-325 MG PO TABS: 1.0000 | ORAL_TABLET | ORAL | Status: DC | PRN

## 2022-10-17 MED ORDER — LIDOCAINE HCL 1 % IJ SOLN
INTRAMUSCULAR | Status: AC
  Filled 2022-10-17: qty 20

## 2022-10-17 MED ORDER — MIDAZOLAM HCL 2 MG/2ML IJ SOLN
INTRAMUSCULAR | Status: AC
  Filled 2022-10-17: qty 2

## 2022-10-17 MED ORDER — LIDOCAINE HCL 1 % IJ SOLN
20.0000 mL | Freq: Once | INTRAMUSCULAR | Status: AC
  Administered 2022-10-17: 6 mL via INTRADERMAL

## 2022-10-17 MED ORDER — SODIUM CHLORIDE 0.9 % IV SOLN: INTRAVENOUS | Status: DC

## 2022-10-17 MED ORDER — MIDAZOLAM HCL 2 MG/2ML IJ SOLN
INTRAMUSCULAR | Status: AC | PRN
  Administered 2022-10-17: 1 mg via INTRAVENOUS

## 2022-10-17 MED ORDER — IOHEXOL 300 MG/ML  SOLN
150.0000 mL | Freq: Once | INTRAMUSCULAR | Status: AC | PRN
  Administered 2022-10-17: 96 mL via INTRA_ARTERIAL

## 2022-10-17 MED ORDER — FENTANYL CITRATE (PF) 100 MCG/2ML IJ SOLN
INTRAMUSCULAR | Status: AC
  Filled 2022-10-17: qty 2

## 2022-10-17 MED ORDER — ACETAMINOPHEN 500 MG PO TABS: 500.0000 mg | ORAL_TABLET | Freq: Four times a day (QID) | ORAL | Status: DC | PRN

## 2022-10-17 MED ORDER — FENTANYL CITRATE (PF) 100 MCG/2ML IJ SOLN
INTRAMUSCULAR | Status: AC | PRN
  Administered 2022-10-17: 50 ug via INTRAVENOUS

## 2022-10-17 NOTE — H&P (Signed)
Chief Complaint: Patient was seen in consultation today for tinnitus, vertigo, migraine at the request of Sethi,Pramod S  Referring Physician(s): Micki Riley  Supervising Physician: Baldemar Lenis  Patient Status: [facility] - Out-[facility]  History of Present Illness: Dennis Castillo is a 57 y.o. male with PMHs of HTN, throat CA s/p chemoradiation, back pain who was referred to [facility] from neurology team due to tinnitus, vertigo, and migraine.   Patient underwent CTA neck on 09/13/22 due to vertigo and left tinnitus which  showed:   1. 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 Occluded right vertebral origin with no significant reconstitution. At the vertebrobasilar junction flow the right AICA is reconstituted.   2. Left Vertebral Artery remains patent without significant stenosis. The proximal basilar artery is patent although appears somewhat irregular and diminutive.   3. The constellation of #1 and #2 might be a source for posterior fossa symptoms, tinnitus.   4. Negative cervical right carotid artery. Left cervical carotid artery remarkable for soft atherosclerotic plaque of both the left CCA and proximal left ICA, but no significant left carotid stenosis in the Neck.  Patient was referred to Dr. Pearlean Brownie from neurology on 10/04/22 due to dizziness  tinnitus in left left year. He reported that the ringing started 2 years ago and got better spontaneously, but it started to getting worse for a month which prompted consultation visit with Dr. Pearlean Brownie. NIR was requested for diagnostic cerebral angiogram for further evaluation, he presented to [facility] IR today for the procedure.   Patient seen with Dr. Tommie Sams. He is laying in bed, not in acute distress.  Denise fever, chills, shortness of breath, cough, chest pain, abdominal pain, nausea ,vomiting, and bleeding.  Reports that he has ringing and "whooshing" sound in his left ear at all time  and intermittent migraine. There has been pressure in the back of his left head and in his left eye sometimes, it makes it feel like his left eye is going to "pop out."   The sound gets worse with activities that increases his BP.  Laying flat in the dark room is the only way to get any relief for migraine. Nothing makes the noise in his left ear disappear.   Risks and benefit of diagnostic cerebral angiogram was dicussed with the patient in detail, he was notified that we may not find anything that is treatable and  he may not get an answer why he is having tinnitus and migraine.  He verbalized understanding.    Past Medical History:  Diagnosis Date   Depression    HTN (hypertension)    Throat cancer (HCC)     Past Surgical History:  Procedure Laterality Date   FOOT SURGERY Right    KNEE SURGERY Left    REPLACEMENT DISC ANTERIOR LUMBAR SPINE      Allergies: Statins  Medications: Prior to Admission medications   Medication Sig Start Date End Date Taking? Authorizing Provider  albuterol (VENTOLIN HFA) 108 (90 Base) MCG/ACT inhaler Inhale 1-2 puffs into the lungs every 6 (six) hours as needed for wheezing or shortness of breath. 07/11/22  Yes [provider]  aspirin EC 81 MG tablet Take 81 mg by mouth daily. 09/20/22 10/20/22 Yes [provider]  busPIRone (BUSPAR) 10 MG tablet Take 1 tablet by mouth 2 (two) times daily as needed (anxiety). 06/09/20  Yes [provider]  cetirizine (ZYRTEC) 10 MG tablet Take 10 mg by mouth daily as  needed for allergies or rhinitis. 01/11/15  Yes [provider]  cyclobenzaprine (FLEXERIL) 5 MG tablet Take 5 mg by mouth daily as needed for muscle spasms. 01/30/22  Yes [provider]  docusate sodium (COLACE) 100 MG capsule Take 100 mg by mouth 2 (two) times daily as needed for mild constipation or moderate constipation. 09/20/22  Yes [provider]  eszopiclone (LUNESTA) 2 MG TABS tablet Take 2 mg by  mouth at bedtime.   Yes [provider]  FLUoxetine (PROZAC) 20 MG capsule Take 20 mg by mouth daily.   Yes [provider]  gabapentin (NEURONTIN) 300 MG capsule Take 300 mg by mouth as needed. 07/27/22  Yes [provider]  levothyroxine (SYNTHROID) 137 MCG tablet Take 137 mcg by mouth daily before breakfast.   Yes [provider]  metoprolol succinate (TOPROL-XL) 25 MG 24 hr tablet Take 12.5 mg by mouth daily.   Yes [provider]  morphine (MS CONTIN) 30 MG 12 hr tablet Take 30 mg by mouth every 12 (twelve) hours.   Yes [provider]  omeprazole (PRILOSEC) 20 MG capsule Take 20 mg by mouth daily as needed.   Yes [provider]  ondansetron (ZOFRAN) 4 MG tablet Take 4 mg by mouth every 8 (eight) hours as needed for nausea. 09/20/22  Yes [provider]  oxyCODONE-acetaminophen (PERCOCET) 10-325 MG tablet Take 1 tablet by mouth every 6 (six) hours as needed for pain. 06/12/22  Yes [provider]  polyethylene glycol powder (GLYCOLAX/MIRALAX) 17 GM/SCOOP powder Take 1 Container by mouth daily. 01/28/14  Yes [provider]  testosterone cypionate (DEPOTESTOSTERONE CYPIONATE) 200 MG/ML injection Inject 200 mg into the muscle every 14 (fourteen) days.   Yes [provider]  zonisamide (ZONEGRAN) 25 MG capsule Take 25 mg by mouth daily.   Yes [provider]  LORazepam (ATIVAN) 0.5 MG tablet Take 1 tablet (0.5 mg total) by mouth as directed. Take 1 tablet 30 minutes prior to MRI and may repeat x1 if needed 10/04/22   Micki Riley, MD  naloxone [facility]) nasal spray 4 mg/0.1 mL Place 1 spray into the nose once. 05/15/22   [provider]     Family History  Problem Relation Age of Onset   Heart attack Mother    Dementia Father    Hypertension Sister    Diabetes Sister    Stroke Brother    Heart disease Brother     Social History   Socioeconomic History   Marital status:  Married    Spouse name: Not on file   Number of children: Not on file   Years of education: Not on file   Highest education level: Not on file  Occupational History   Not on file  Tobacco Use   Smoking status: Former   Smokeless tobacco: Never   Tobacco comments:    smoked from 10 to 89 and has since quit, tobacco dip for 4-5 yrs and has been quit  Substance and Sexual Activity   Alcohol use: Not Currently   Drug use: Not Currently   Sexual activity: Not on file  Other Topics Concern   Not on file  Social History Narrative   Not on file   Social Determinants of Health   Financial Resource Strain: Not on file  Food Insecurity: Not on file  Transportation Needs: Not on file  Physical Activity: Not on file  Stress: Not on file  Social Connections: Not on file  Review of Systems: A 12 point ROS discussed and pertinent positives are indicated in the HPI above.  All other systems are negative.  Vital Signs: BP (!) 136/92   Pulse 78   Temp 99 F (37.2 C) (Temporal)   Resp 16   Ht 5\' 10"  (1.778 m)   Wt 240 lb (108.9 kg)   SpO2 99%   BMI 34.44 kg/m    Physical Exam Vitals and nursing note reviewed.  Constitutional:      General: Patient is not in acute distress.    Appearance: Normal appearance. Patient is not ill-appearing.  HENT:     Head: Normocephalic and atraumatic.     Mouth/Throat:     Mouth: Mucous membranes are moist.     Pharynx: Oropharynx is clear.  Cardiovascular:     Rate and Rhythm: Normal rate and regular rhythm.     Pulses: Normal pulses.     Heart sounds: Normal heart sounds.  Pulmonary:     Effort: Pulmonary effort is normal.     Breath sounds: Normal breath sounds.  Abdominal:     General: Abdomen is flat. Bowel sounds are normal.     Palpations: Abdomen is soft.  Musculoskeletal:     Cervical back: Neck supple.  Skin:    General: Skin is warm and dry.     Coloration: Skin is not jaundiced or pale.  Neurological:     Mental  Status: Patient is alert and oriented to person, place, and time.  Psychiatric:        Mood and Affect: Mood normal.        Behavior: Behavior normal.        Judgment: Judgment normal.    MD Evaluation Airway: WNL Heart: WNL Abdomen: WNL Chest/ Lungs: WNL ASA  Classification: 3 Mallampati/Airway Score: Two  Imaging: No results found.  Labs:  CBC: Recent Labs    08/08/22 0441 10/17/22 1037  WBC 9.4 6.4  HGB 10.4* 10.2*  HCT 36.3* 35.5*  PLT 269 276    COAGS: No results for input(s): "INR", "APTT" in the last 8760 hours.  BMP: Recent Labs    08/08/22 0441 10/17/22 1037  NA 139 139  K 3.8 3.6  CL 102 105  CO2 28 21*  GLUCOSE 106* 98  BUN 15 14  CALCIUM 9.9 9.2  CREATININE 0.89 0.98  GFRNONAA >60 >60    LIVER FUNCTION TESTS: No results for input(s): "BILITOT", "AST", "ALT", "ALKPHOS", "PROT", "ALBUMIN" in the last 8760 hours.  TUMOR MARKERS: No results for input(s): "AFPTM", "CEA", "CA199", "CHROMGRNA" in the last 8760 hours.  Assessment and Plan: 57 y.o. male with left tinnitus, vertigo, migraine who presents for diagnostic cerebral angiogram.   NPO since MN VS hypertension, 164/106.  CBC with stable anemia hgb 10.2 RF wnl  Allergies reviewed   Risks and benefits of cerebral angiogram with intervention were discussed with the patient including, but not limited to bleeding, infection, vascular injury, contrast induced renal failure, stroke or even death.  This interventional procedure involves the use of X-rays and because of the nature of the planned procedure, it is possible that we will have prolonged use of X-ray fluoroscopy.  Potential radiation risks to you include (but are not limited to) the following: - A slightly elevated risk for cancer  several years later in life. This risk is typically less than 0.5% percent. This risk is low in comparison to the normal incidence of human cancer, which is 33% for women and 50%  for men according to the  [employer]. - Radiation induced injury can include skin redness, resembling a rash, tissue breakdown / ulcers and hair loss (which can be temporary or permanent).   The likelihood of either of these occurring depends on the difficulty of the procedure and whether you are sensitive to radiation due to previous procedures, disease, or genetic conditions.   IF your procedure requires a prolonged use of radiation, you will be notified and given written instructions for further action.  It is your responsibility to monitor the irradiated area for the 2 weeks following the procedure and to notify your physician if you are concerned that you have suffered a radiation induced injury.    All of the patient's questions were answered, patient is agreeable to proceed.  Consent signed and in chart.    Thank you for this interesting consult.  I greatly enjoyed meeting Dennis Castillo and look forward to participating in their care.  A copy of this report was sent to the requesting provider on this date.  Electronically Signed: Willette Brace, PA-C 10/17/2022, 12:09 PM   I spent a total of  40 Minutes   in face to face in clinical consultation, greater than 50% of which was counseling/coordinating care for diagnostic cerebral angiogram.   This chart was dictated using voice recognition software.  Despite best efforts to proofread,  errors can occur which can change the documentation meaning.

## 2022-10-21 ENCOUNTER — Other Ambulatory Visit: Payer: Self-pay | Admitting: Neurology

## 2022-11-06 ENCOUNTER — Telehealth: Payer: Self-pay | Admitting: Neurology

## 2022-11-06 NOTE — Telephone Encounter (Signed)
MRI of the brain results were faxed over where the pt completed through [facility]

## 2023-01-10 ENCOUNTER — Ambulatory Visit (INDEPENDENT_AMBULATORY_CARE_PROVIDER_SITE_OTHER): Payer: BC Managed Care – PPO | Admitting: Neurology

## 2023-01-10 ENCOUNTER — Encounter: Payer: Self-pay | Admitting: Neurology

## 2023-01-10 VITALS — BP 110/74 | HR 76 | Ht 70.0 in | Wt 254.0 lb

## 2023-01-10 DIAGNOSIS — H9312 Tinnitus, left ear: Secondary | ICD-10-CM

## 2023-01-10 DIAGNOSIS — G44221 Chronic tension-type headache, intractable: Secondary | ICD-10-CM | POA: Diagnosis not present

## 2023-01-10 DIAGNOSIS — R42 Dizziness and giddiness: Secondary | ICD-10-CM | POA: Diagnosis not present

## 2023-01-10 NOTE — Patient Instructions (Signed)
I had a long discussion with patient and his wife regarding his longstanding tinnitus and dizziness and discussed results of neuroimaging studies and cerebral angiogram and answered questions.  Unfortunately the did not think much we can do at this point for this persistent symptoms.  He was advised to continue ongoing outpatient physical therapy which seems to be helping.  He also has chronic tension headaches which also seem refractory to current medication hence I will request referral to Dr. Lucia Gaskins headache specialist for further evaluation and treatment for these.  No scheduled follow-up appointment with me is necessary.  Tension Headache, Adult A tension headache is a feeling of pain, pressure, or aching over the front and sides of the head. The pain can be dull, or it can feel tight. There are two types of tension headache: Episodic tension headache. This is when the headaches happen fewer than 15 days a month. Chronic tension headache. This is when the headaches happen more than 15 days a month during a 13-month period. A tension headache can last from 30 minutes to several days. It is the most common kind of headache. Tension headaches are not normally associated with nausea or vomiting, and they do not get worse with physical activity. What are the causes? The exact cause of this condition is not known. Tension headaches are often triggered by stress, anxiety, or depression. Other triggers may include: Alcohol. Too much caffeine or caffeine withdrawal. Respiratory infections, such as colds, flu, or sinus infections. Dental problems or teeth clenching. Fatigue. Holding your head and neck in the same position for a long period of time, such as while using a computer. Smoking. Arthritis of the neck. What are the signs or symptoms? Symptoms of this condition include: A feeling of pressure or tightness around the head. Dull, aching head pain. Pain over the front and sides of the  head. Tenderness in the muscles of the head, neck, and shoulders. How is this diagnosed? This condition may be diagnosed based on your symptoms, your medical history, and a physical exam. If your symptoms are severe or unusual, you may have imaging tests, such as a CT scan or an MRI of your head. Your vision may also be checked. How is this treated? This condition may be treated with lifestyle changes and with medicines that help relieve symptoms. Follow these instructions at home: Managing pain Take over-the-counter and prescription medicines only as told by your health care provider. When you have a headache, lie down in a dark, quiet room. If directed, put ice on your head and neck. To do this: Put ice in a plastic bag. Place a towel between your skin and the bag. Leave the ice on for 20 minutes, 2-3 times a day. Remove the ice if your skin turns bright red. This is very important. If you cannot feel pain, heat, or cold, you have a greater risk of damage to the area. If directed, apply heat to the back of your neck as often as told by your health care provider. Use the heat source that your health care provider recommends, such as a moist heat pack or a heating pad. Place a towel between your skin and the heat source. Leave the heat on for 20-30 minutes. Remove the heat if your skin turns bright red. This is especially important if you are unable to feel pain, heat, or cold. You have a greater risk of getting burned. Eating and drinking Eat meals on a regular schedule. If you drink alcohol:  Limit how much you have to: 0-1 drink a day for women who are not pregnant. 0-2 drinks a day for men. Know how much alcohol is in your drink. In the [place] one drink equals one 12 oz bottle of beer (355 mL), one 5 oz glass of wine (148 mL), or one 1 oz glass of hard liquor (44 mL). Drink enough fluid to keep your urine pale yellow. Decrease your caffeine intake, or stop using  caffeine. Lifestyle Get 7-9 hours of sleep each night, or get the amount of sleep recommended by your health care provider. At bedtime, remove computers, phones, and tablets from your room. Find ways to manage your stress. This may include: Exercise. Deep breathing exercises. Yoga. Listening to music. Positive mental imagery. Try to sit up straight and avoid tensing your muscles. Do not use any products that contain nicotine or tobacco. These include cigarettes, chewing tobacco, and vaping devices, such as e-cigarettes. If you need help quitting, ask your health care provider. General instructions  Avoid any headache triggers. Keep a journal to help find out what may trigger your headaches. For example, write down: What you eat and drink. How much sleep you get. Any change to your diet or medicines. Keep all follow-up visits. This is important. Contact a health care provider if: Your headache does not get better. Your headache comes back. You are sensitive to sounds, light, or smells because of a headache. You have nausea or you vomit. Your stomach hurts. Get help right away if: You suddenly develop a severe headache, along with any of the following: A stiff neck. Nausea and vomiting. Confusion. Weakness in one part or one side of your body. Double vision or loss of vision. Shortness of breath. Rash. Unusual sleepiness. Fever or chills. Trouble speaking. Pain in your eye or ear. Trouble walking or balancing. Feeling faint or passing out. Summary A tension headache is a feeling of pain, pressure, or aching over the front and sides of the head. A tension headache can last from 30 minutes to several days. It is the most common kind of headache. This condition may be diagnosed based on your symptoms, your medical history, and a physical exam. This condition may be treated with lifestyle changes and with medicines that help relieve symptoms. This information is not intended to  replace advice given to you by your health care provider. Make sure you discuss any questions you have with your health care provider. Document Revised: 12/25/2019 Document Reviewed: 12/25/2019 Elsevier Patient Education  2024 [employer].

## 2023-01-10 NOTE — Progress Notes (Signed)
[facility] [address] [place]. [place] [address] [phone]       OFFICE FOLLOW-UP VISIT NOTE  Mr. APOLLO TIMOTHY Date of Birth:  1965/04/20 Medical Record Number:  [id number]   Referring MD:  Beulah Gandy, PA-c  Reason for Referral: Dizziness and dizzy spells  HPI: Initial visit 10/04/2022 Dennis Castillo is a 57 year old Caucasian male seen today for initial office consultation visit.  He is accompanied by his wife.  History is obtained from them and review of electronic medical records in care everywhere.  I have reviewed pertinent available imaging films in PACS.  He has past medical history of hypertension, depression and throat cancer.  He states for the last 2 years he has had nonpulsatile tinnitus in his left ear which was bad for the initial few weeks but then settled down.  For the last month or so it seems to have gotten worse again.  He is able to sleep despite the tinnitus.  He describes it as a whooshing sound.  This does not appear to be related to any neck position or putting pressure on the neck does not get rid of it.  He has been requiring sleeping aids to sleep he initially took Ambien and 1 more recently he was changed to [place].  On 07/17/2022 he developed sudden onset of dizziness.  He describes this as a sensation of room spinning in the anticoagulates direction.  This was initially intermittent in the first couple of weeks but now has progressed and become constant.  This seems to occur irrespective of any head position or movement.  He also describes sensation of head pressure and aching and throbbing headaches with occasionally becomes severe 9/10 in severity with accompanying nausea.  No light or sound sensitivity.  Headache occurs several times a day.  Over-the-counter medication does take the edge off.  He does have history of chronic migraines but feels these headaches are different.  He takes zonisamide 25mg  as needed which helps him.  He denies any  visual symptoms or focal neurological symptoms accompanying his headache.  He does have a history of bilateral ear tympanoplasty with tinnitus began only several years later.  He denies any history of stroke TIA seizures or other significant neurological problems.  He has been seen in the headache clinic in [place] for his chronic migraine and was initially treated with Botox.  He does have chronic back pain and has undergone back surgery and takes MS Contin and oxycodone for his back and foot pain.  He did try meclizine for his dizziness and vertigo but it did not help so he stopped it.  He had an MRI scan of the brain done on 08/08/2022 which is quite limited due to poor patient cooperation only a few sequences were obtained.  CT angiogram of the brain and neck on 08/08/2022 shows chronic right vertebral artery occlusion.  He was seen at ENT clinic at [facility] and had audiometric testing done and was found not to have benign paroxysmal positional vertigo.  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  He is accompanied by his wife.  He has noticed some improvement in his dizziness which is not as bad.  He is doing outpatient physical therapy which seems to be helping.  He still has ringing sound in his ear.  He underwent diagnostic cerebral catheter angiogram on 10/17/2022 by Dr. 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  MRI scan of the brain with and without contrast on 10/31/2022 showed no significant abnormalities.  No structural lesion was noted.  Previous MRI on 08/08/2022 was suboptimal due to patient not cooperating only limited diffusion images were obtained which were negative.  Patient continues to have chronic daily tension headaches.  He describes pressure-like sensation in  the back of his head.  He has a moderate 5/10 headache most days but occasionally it gets severe to 8/10.  He needs to lie down and about once a week at least due to severe headache.  He was previously followed at the [facility] headache clinic but and the physician has left his been seeing nurse practitioner and is not happy.  He gets Emgality shots and takes only some mild at night.  He has tried Botox previously which has not helped him.  He wants to be referred to headache specialist.  He had recent surgery on the left foot for neuroma but unfortunately postprocedure he developed infection requiring antibiotics which she had trouble tolerating. ROS:   14 system review of systems is positive for dizziness, vertigo, tinnitus, leg pain, difficulty walking, daily headache, foot pain and infection all other systems negative  PMH:  Past Medical History:  Diagnosis Date   Depression    HTN (hypertension)    Throat cancer (HCC)     Social History:  Social History   Socioeconomic History   Marital status: Married    Spouse name: Not on file   Number of children: Not on file   Years of education: Not on file   Highest education level: Not on file  Occupational History   Not on file  Tobacco Use   Smoking status: Former   Smokeless tobacco: Never   Tobacco comments:    smoked from 10 to 81 and has since quit, tobacco dip for 4-5 yrs and has been quit  Substance and Sexual Activity   Alcohol use: Not Currently   Drug use: Not Currently   Sexual activity: Not on file  Other Topics Concern   Not on file  Social History Narrative   Not on file   Social Determinants of Health   Financial Resource Strain: Low Risk  (07/09/2022)   Received from [facility], [facility]   Overall Financial Resource Strain (CARDIA)    Difficulty of Paying Living Expenses: Not hard at all  Food Insecurity: No Food Insecurity (07/09/2022)   Received from [facility], [facility]   Hunger Vital Sign     Worried About Running Out of Food in the Last Year: Never true    Ran Out of Food in the Last Year: Never true  Transportation Needs: No Transportation Needs (07/09/2022)   Received from [employer], [facility]   PRAPARE - Transportation    Lack of Transportation (Medical): No    Lack of Transportation (Non-Medical): No  Physical Activity: Insufficiently Active (07/09/2022)   Received from [facility], [facility]   Exercise Vital Sign    Days of Exercise per Week: 3 days    Minutes of Exercise per Session: 20 min  Stress: Stress Concern Present (07/09/2022)   Received from [facility], [facility] - Occupational Stress Questionnaire    Feeling of Stress : Rather much  Social Connections: Moderately Integrated (07/09/2022)   [facility]  [facility]   Social Network    How would you rate your social network (family, work, friends)?: Adequate participation with social networks  Intimate Partner Violence: Not At Risk (07/09/2022)   Received from [facility], [facility]   HITS    Over the last 12 months how often did your partner physically hurt you?: 1    Over the last 12 months how often did your partner insult you or talk down to you?: 1    Over the last 12 months how often did your partner threaten you with physical harm?: 1    Over the last 12 months how often did your partner scream or curse at you?: 1    Medications:   Current Outpatient Medications on File Prior to Visit  Medication Sig Dispense Refill   albuterol (VENTOLIN HFA) 108 (90 Base) MCG/ACT inhaler Inhale 1-2 puffs into the lungs every 6 (six) hours as needed for wheezing or shortness of breath.     cetirizine (ZYRTEC) 10 MG tablet Take 10 mg by mouth daily as needed for allergies or rhinitis.     cyclobenzaprine (FLEXERIL) 5 MG tablet Take 5 mg by mouth daily as needed for muscle spasms.     docusate sodium (COLACE) 100 MG capsule Take 100  mg by mouth 2 (two) times daily as needed for mild constipation or moderate constipation.     eszopiclone (LUNESTA) 2 MG TABS tablet Take 2 mg by mouth at bedtime.     gabapentin (NEURONTIN) 300 MG capsule Take 300 mg by mouth as needed.     levothyroxine (SYNTHROID) 137 MCG tablet Take 137 mcg by mouth daily before breakfast.     metoprolol succinate (TOPROL-XL) 25 MG 24 hr tablet Take 12.5 mg by mouth daily.     morphine (MS CONTIN) 30 MG 12 hr tablet Take 30 mg by mouth every 12 (twelve) hours.     naloxone (NARCAN) nasal spray 4 mg/0.1 mL Place 1 spray into the nose once.     omeprazole (PRILOSEC) 20 MG capsule Take 20 mg by mouth daily as needed.     ondansetron (ZOFRAN) 4 MG tablet Take 4 mg by mouth every 8 (eight) hours as needed for nausea.     oxyCODONE-acetaminophen (PERCOCET) 10-325 MG tablet Take 1 tablet by mouth every 6 (six) hours as needed for pain.     polyethylene glycol powder (GLYCOLAX/MIRALAX) 17 GM/SCOOP powder Take 1 Container by mouth daily.     testosterone cypionate (DEPOTESTOSTERONE CYPIONATE) 200 MG/ML injection Inject 200 mg into the muscle every 14 (fourteen) days.     venlafaxine XR (EFFEXOR-XR) 37.5 MG 24 hr capsule Take 37.5 mg by mouth daily.     zonisamide (ZONEGRAN) 25 MG capsule Take 25 mg by mouth daily.     No current facility-administered medications on file prior to visit.    Allergies:   Allergies  Allergen Reactions   Statins Other (See Comments)    Flu like symptoms, muscle weakness   Repatha [Evolocumab]     Pt has sever swelling and congestion.     Physical Exam General: Obese middle-aged Caucasian male, seated, in no evident distress Head: head normocephalic and atraumatic.   Neck: supple with no carotid or supraclavicular bruits Cardiovascular: regular rate and rhythm, no murmurs Musculoskeletal: no deformity.  Left foot boot for recent surgery Skin:  no rash/petichiae Vascular:  Normal pulses all extremities  Neurologic  Exam Mental Status: Awake and fully alert. Oriented to place and time.  Recent and remote memory intact. Attention span, concentration and fund of knowledge appropriate. Mood and affect appropriate.  Cranial Nerves: Fundoscopic exam not done. Pupils equal, briskly reactive to light. Extraocular movements full without nystagmus. Visual fields full to confrontation. Hearing intact. Facial sensation intact. Face, tongue, palate moves normally and symmetrically.  Motor: Normal bulk and tone. Normal strength in all tested extremity muscles.  Right foot strength testing limited due to recent surgery and boot Sensory.: intact to touch , pinprick , position and vibratory sensation.  Coordination: Rapid alternating movements normal in all extremities. Finger-to-nose and heel-to-shin performed accurately bilaterally.  Positive Fukuda test with patient moving off base and rotating to the left.  Headshaking produces subjective dizziness but no objective nystagmus. Gait and Station: Arises from chair without difficulty. Stance is normal. Gait is cautious with favoring of the right foot due to boot and recent surgery.  Did not test tandem walking  reflexes: 1+ and symmetric. Toes downgoing.       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  Longstanding history of migraine headaches with  transformed chronic daily headaches with component of tension headache  which has been refractory to treatment   PLAN:I had a long discussion with patient and his wife regarding his longstanding tinnitus and dizziness and discussed results of neuroimaging studies and cerebral angiogram and answered questions.  Unfortunately the did not think much we can do at this point for this persistent symptoms.  He was advised to continue ongoing outpatient  physical therapy which seems to be helping.  He also has chronic tension headaches which also seem refractory to current medication hence I will request referral to Dr. Lucia Gaskins headache specialist for further evaluation and treatment for these.  No scheduled follow-up appointment with me is necessary.Greater than 50% time during this prolonged 35 minute visit was spent in counseling and coordination of care about his dizziness, tinnitus and discussion about evaluation and treatment and answering questions.  Delia Heady, MD Note: This document was prepared with digital dictation and possible smart phrase technology. Any transcriptional errors that result from this process are unintentional.
